# Patient Record
Sex: Male | Born: 1965 | Hispanic: Yes | Marital: Married | Smoking: Former smoker
Health system: Southern US, Community
[De-identification: ages and names within clinical notes are randomized; demographics above are authoritative.]

## PROBLEM LIST (undated history)

## (undated) DIAGNOSIS — I10 Essential (primary) hypertension: Secondary | ICD-10-CM

## (undated) DIAGNOSIS — M79609 Pain in unspecified limb: Secondary | ICD-10-CM

## (undated) HISTORY — PX: OTHER SURGICAL HISTORY: SHX169

## (undated) HISTORY — DX: Pain in unspecified limb: M79.609

## (undated) HISTORY — DX: Essential (primary) hypertension: I10

---

## 2009-12-26 ENCOUNTER — Emergency Department: Payer: Self-pay | Admitting: Unknown Physician Specialty

## 2011-06-08 IMAGING — CT CT ABD-PELV W/ CM
1 of 2 series · 15 of 32 positions shown, 19 images · non-contrast
Comparison: none

REASON FOR EXAM: (1) rectal bleeding pain in rectum; (2) same
COMMENTS:   LMP: (Male)

[Series 2: abd/pelvis · axial · 0.77mm/px · z∈[-528,-99]mm · 15 of 157 slices shown, 19 images]
[im 7/157  soft-tissue]
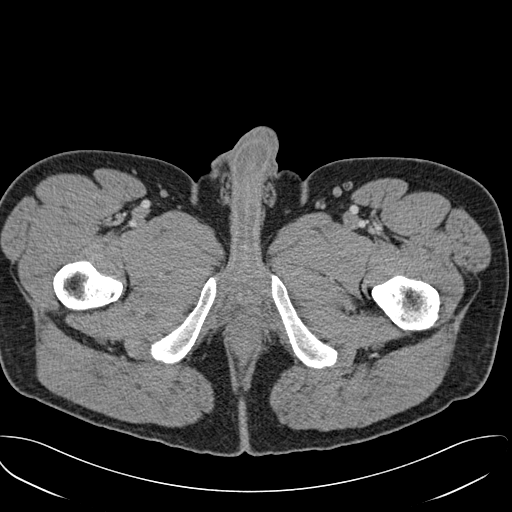
[im 7/157  bone]
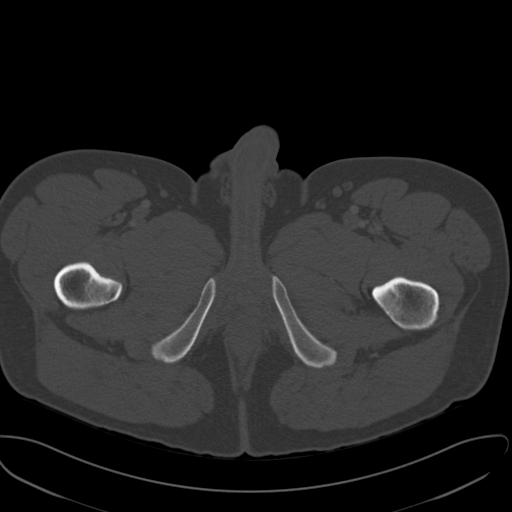
[im 20/157  soft-tissue]
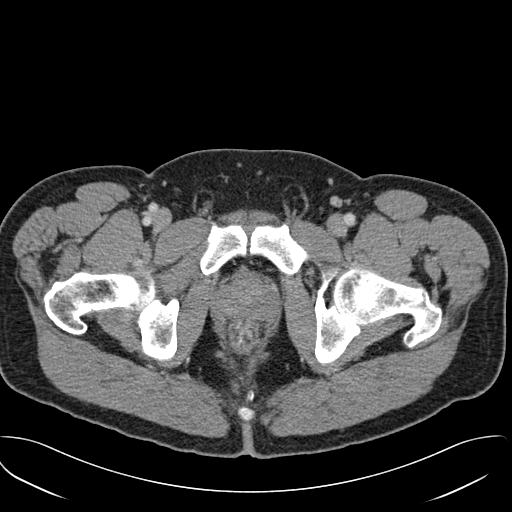
[im 33/157  soft-tissue]
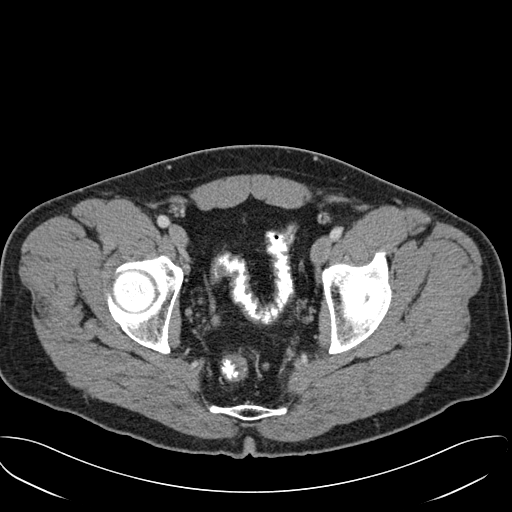
[im 46/157  soft-tissue]
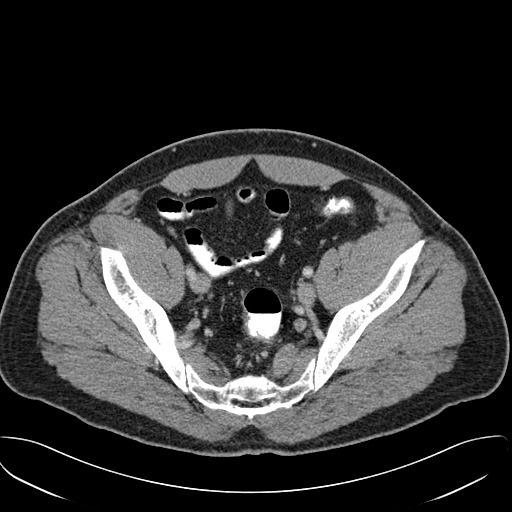
[im 53/157  soft-tissue]
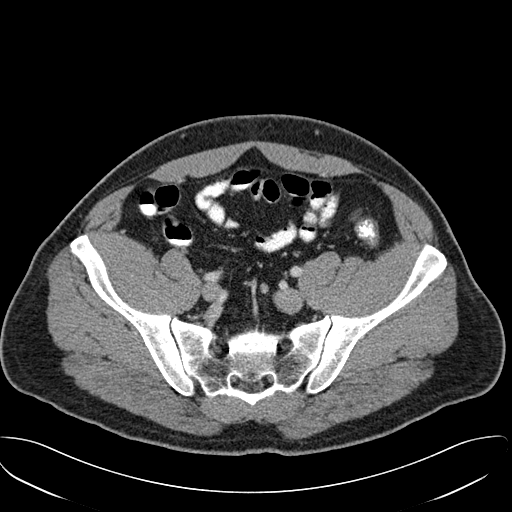
[im 66/157  soft-tissue]
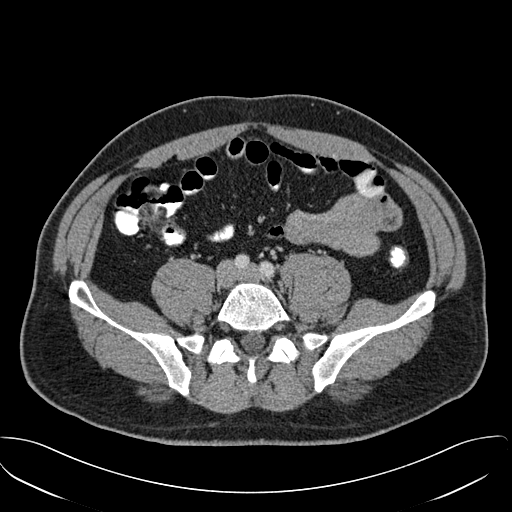
[im 79/157  soft-tissue]
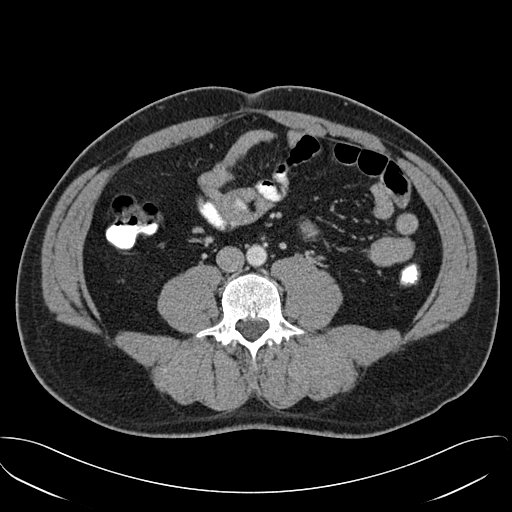
[im 92/157  soft-tissue]
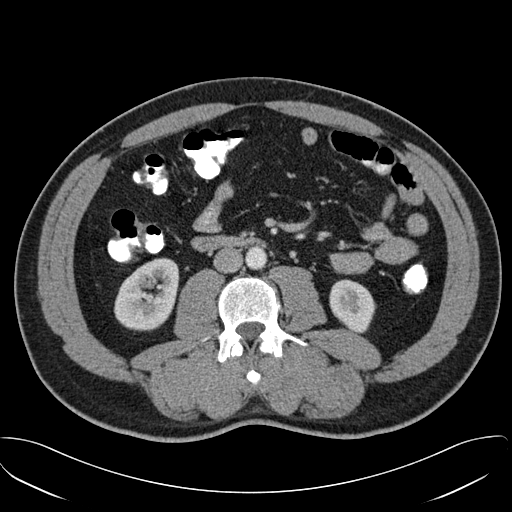
[im 105/157  soft-tissue]
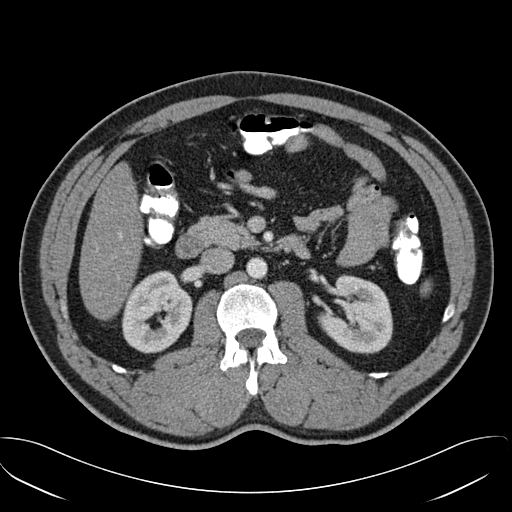
[im 105/157  bone]
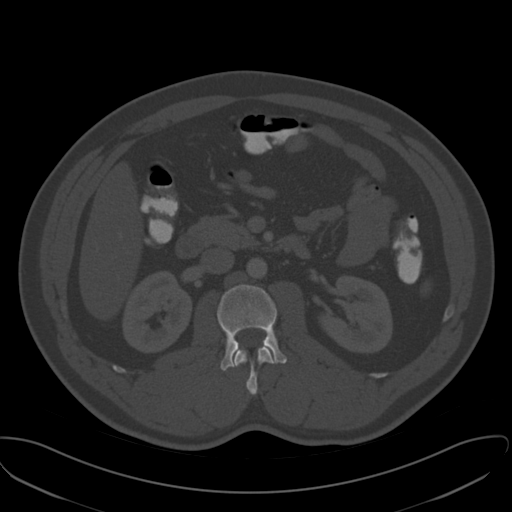
[im 111/157  soft-tissue]
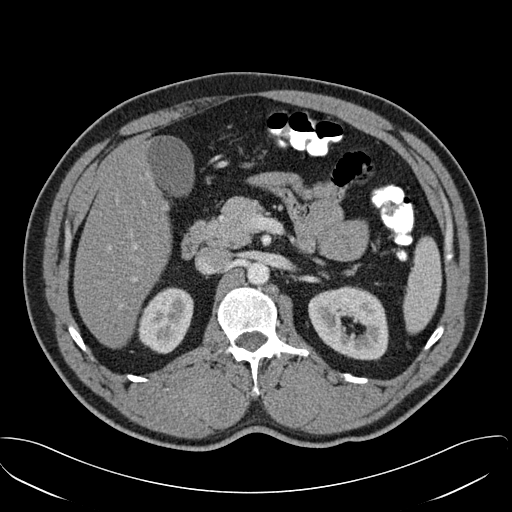
[im 124/157  soft-tissue]
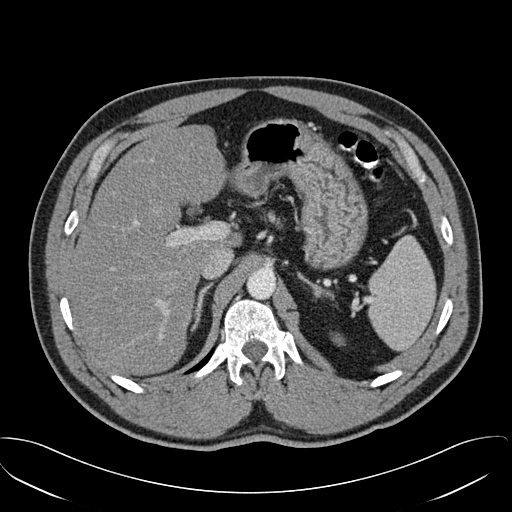
[im 131/157  lung]
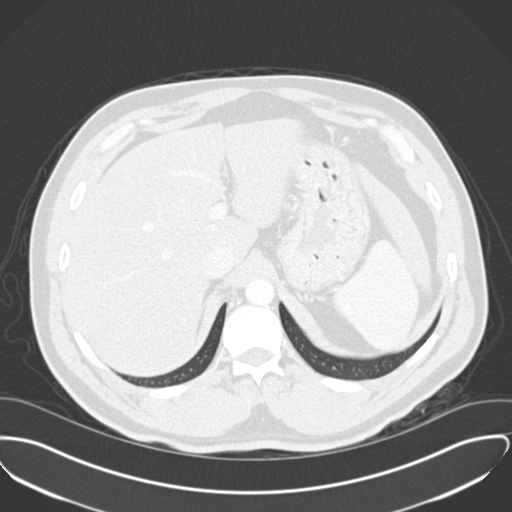
[im 137/157  soft-tissue]
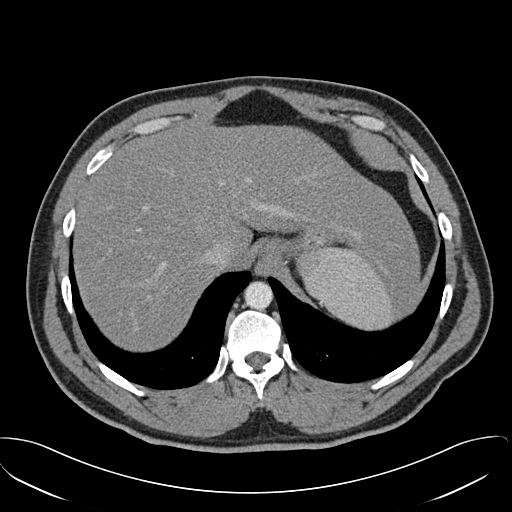
[im 137/157  lung]
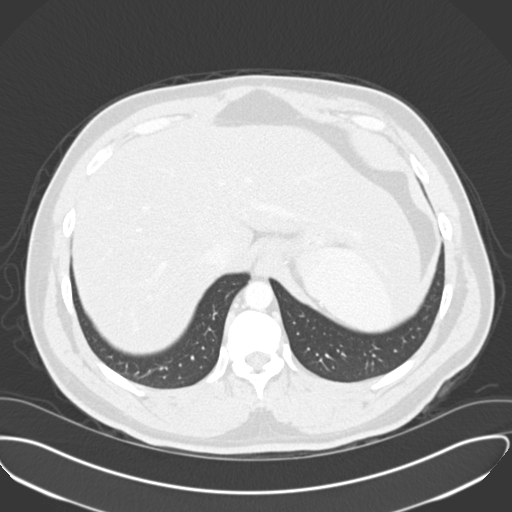
[im 144/157  lung]
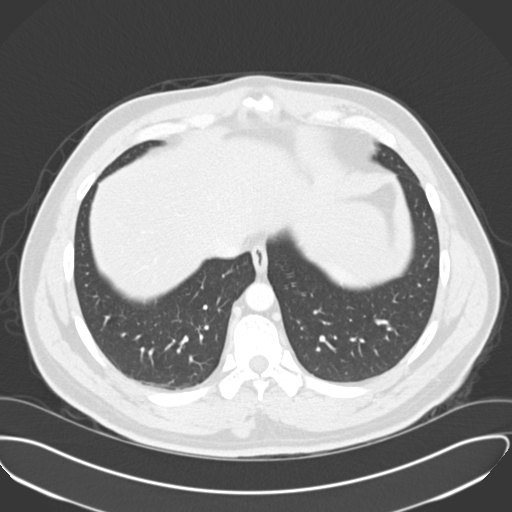
[im 150/157  soft-tissue]
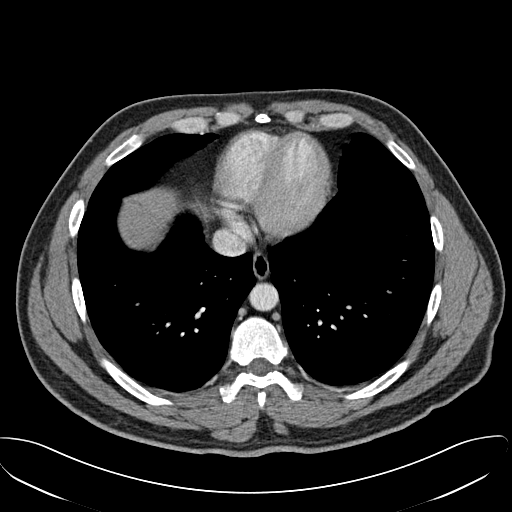
[im 150/157  lung]
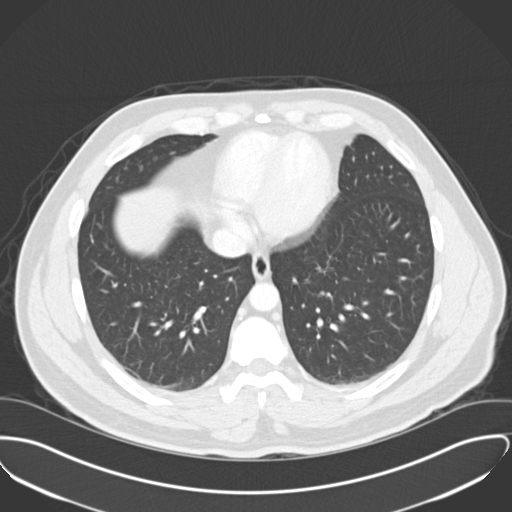

[15 of 32 positions shown; findings below may reference images not displayed]

PROCEDURE:     CT  - CT ABDOMEN / PELVIS  W  - December 27, 2009  [DATE]

RESULT:     Axial CT scanning was performed through the abdomen and pelvis
at 3 mm intervals and slice thicknesses following intravenous administration
of 100 cc of Lsovue-VAC. The patient also received oral contrast material.

The orally administered contrast has traversed the small and large bowel.
There is no evidence of obstruction or ileus or colitis. The appendix is
normal. The rectosigmoid colon exhibits mild thickening of the wall
diffusely but the perisigmoid fat exhibits no inflammatory change. I see no
perirectal abscess or evidence of a rectal mass. There is no free fluid in
the pelvis. No pelvic lymphadenopathy is demonstrated. The urinary bladder
and prostate gland and seminal vesicles are normal in appearance.

The liver exhibits mildly decreased density diffusely consistent with fatty
infiltration. There is no focal mass nor ductal dilation. The gallbladder is
adequately distended with no evidence of stones or inflammation. The
pancreas, spleen, nondistended stomach, adrenal glands, and kidneys exhibit
no acute abnormality. There is a 1 mm diameter midpole stone in the left
kidney. The caliber of the abdominal aorta is normal. I see no periaortic or
pericaval lymphadenopathy. There is no evidence of an inguinal nor umbilical
hernia. The lung bases exhibit no acute abnormality. The lumbar vertebral
bodies are preserved in height.
IMPRESSION: 1. There is very mild thickening of the wall of the rectosigmoid colon
diffusely. I do not see evidence of acute diverticulitis or other acute
rectosigmoid abnormality. No perirectal abscess is identified.
2. There is a nonobstructing 1 mm diameter midpole stone in the left kidney.
3. There are mild fatty infiltrative changes of the liver without evidence
of gallstones or ductal dilation.

A preliminary report was sent to the [HOSPITAL] the conclusion
of the study.

## 2012-09-15 ENCOUNTER — Emergency Department: Payer: Self-pay | Admitting: Emergency Medicine

## 2012-09-15 LAB — URINALYSIS, COMPLETE
Bacteria: NONE SEEN
Bilirubin,UR: NEGATIVE
Blood: NEGATIVE
Glucose,UR: NEGATIVE mg/dL (ref 0–75)
Leukocyte Esterase: NEGATIVE
Ph: 6 (ref 4.5–8.0)
Squamous Epithelial: NONE SEEN

## 2012-09-15 LAB — HEPATIC FUNCTION PANEL A (ARMC)
Bilirubin,Total: 0.4 mg/dL (ref 0.2–1.0)
SGOT(AST): 32 U/L (ref 15–37)
Total Protein: 7.1 g/dL (ref 6.4–8.2)

## 2012-09-15 LAB — BASIC METABOLIC PANEL
BUN: 17 mg/dL (ref 7–18)
Calcium, Total: 8.6 mg/dL (ref 8.5–10.1)
Chloride: 106 mmol/L (ref 98–107)
Co2: 24 mmol/L (ref 21–32)
EGFR (African American): >60
Osmolality: 282 (ref 275–301)
Potassium: 3.8 mmol/L (ref 3.5–5.1)
Sodium: 139 mmol/L (ref 136–145)

## 2012-09-15 LAB — LIPASE, BLOOD: Lipase: 138 U/L (ref 73–393)

## 2012-09-15 LAB — PREGNANCY, URINE: Pregnancy Test, Urine: NEGATIVE m[IU]/mL

## 2012-09-15 LAB — CBC
HCT: 40.2 % (ref 40.0–52.0)
HGB: 14 g/dL (ref 13.0–18.0)
MCH: 30.1 pg (ref 26.0–34.0)
Platelet: 203 10*3/uL (ref 150–440)
RDW: 14 % (ref 11.5–14.5)
WBC: 5.1 10*3/uL (ref 3.8–10.6)

## 2014-02-25 IMAGING — CR DG CHEST 2V
1 series · 2 of 2 positions shown · non-contrast
Comparison: none

REASON FOR EXAM: R lower chest pain
COMMENTS:

[Series 1: pa · 0.17mm/px · 2 of 2 slices shown]
[im 1/2]
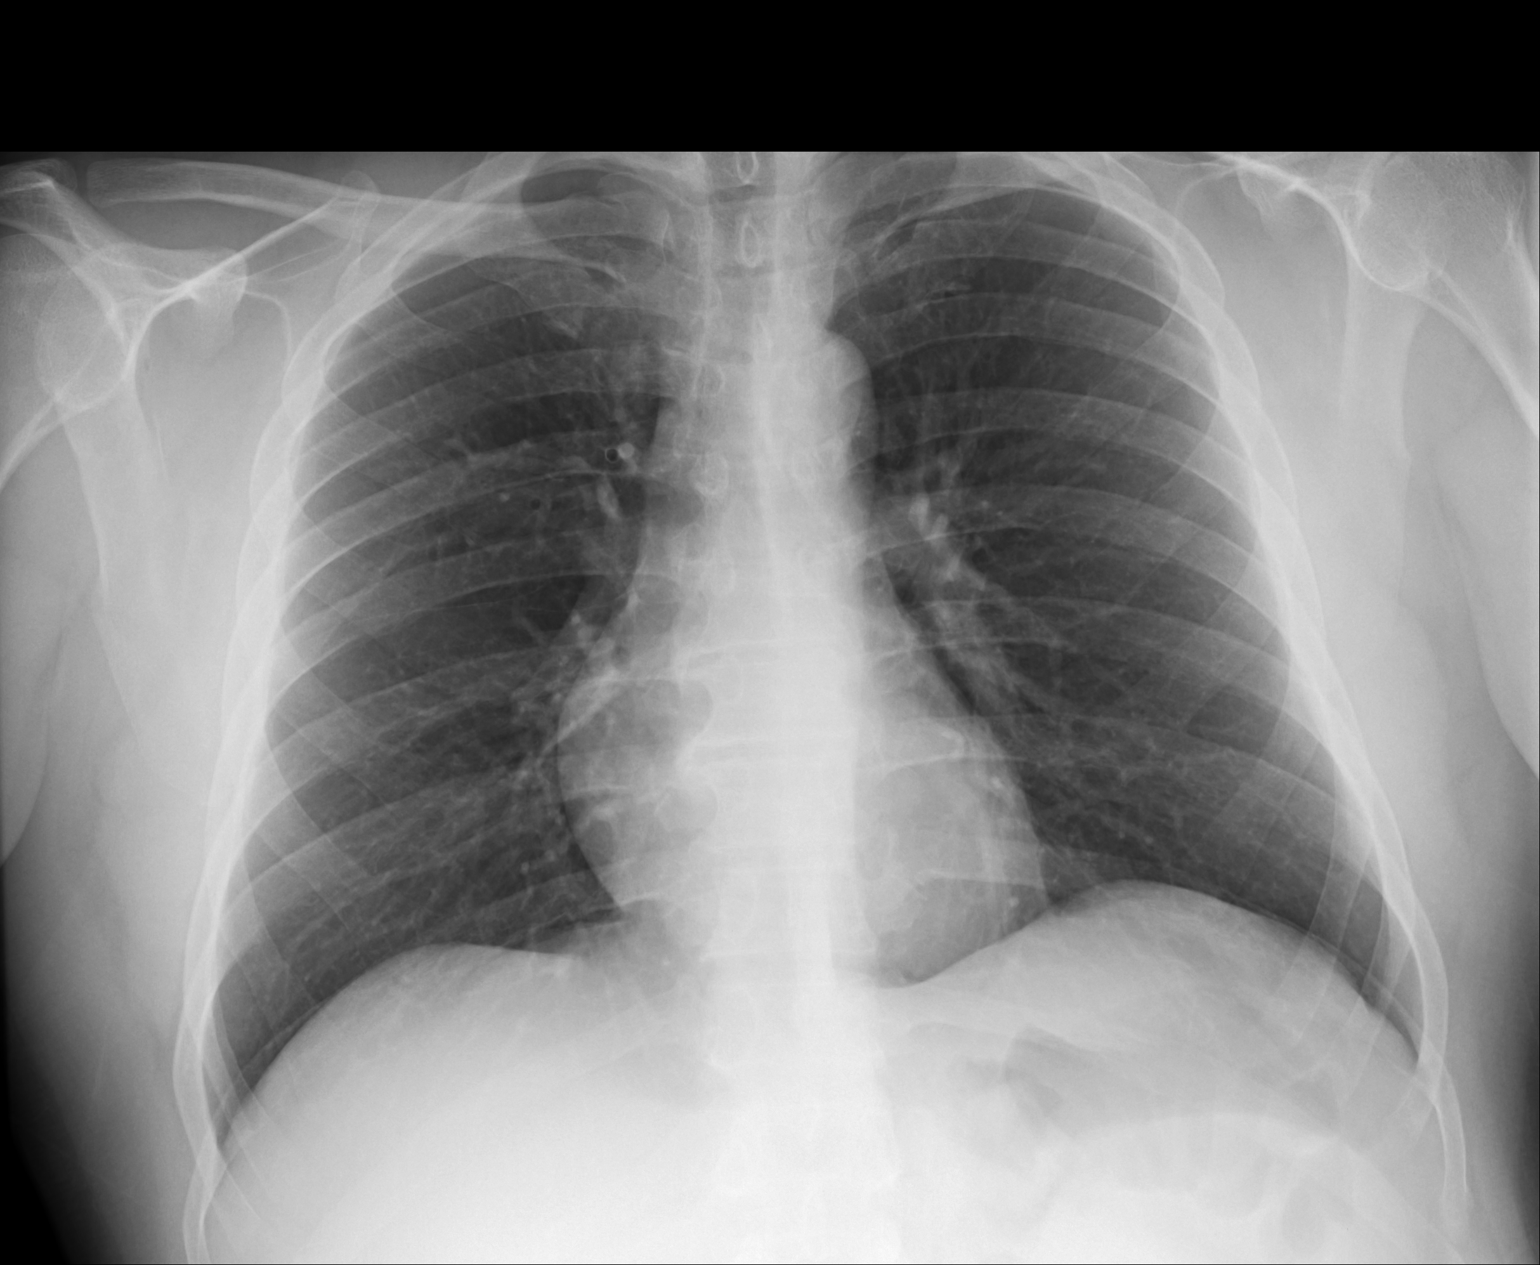
[im 2/2]
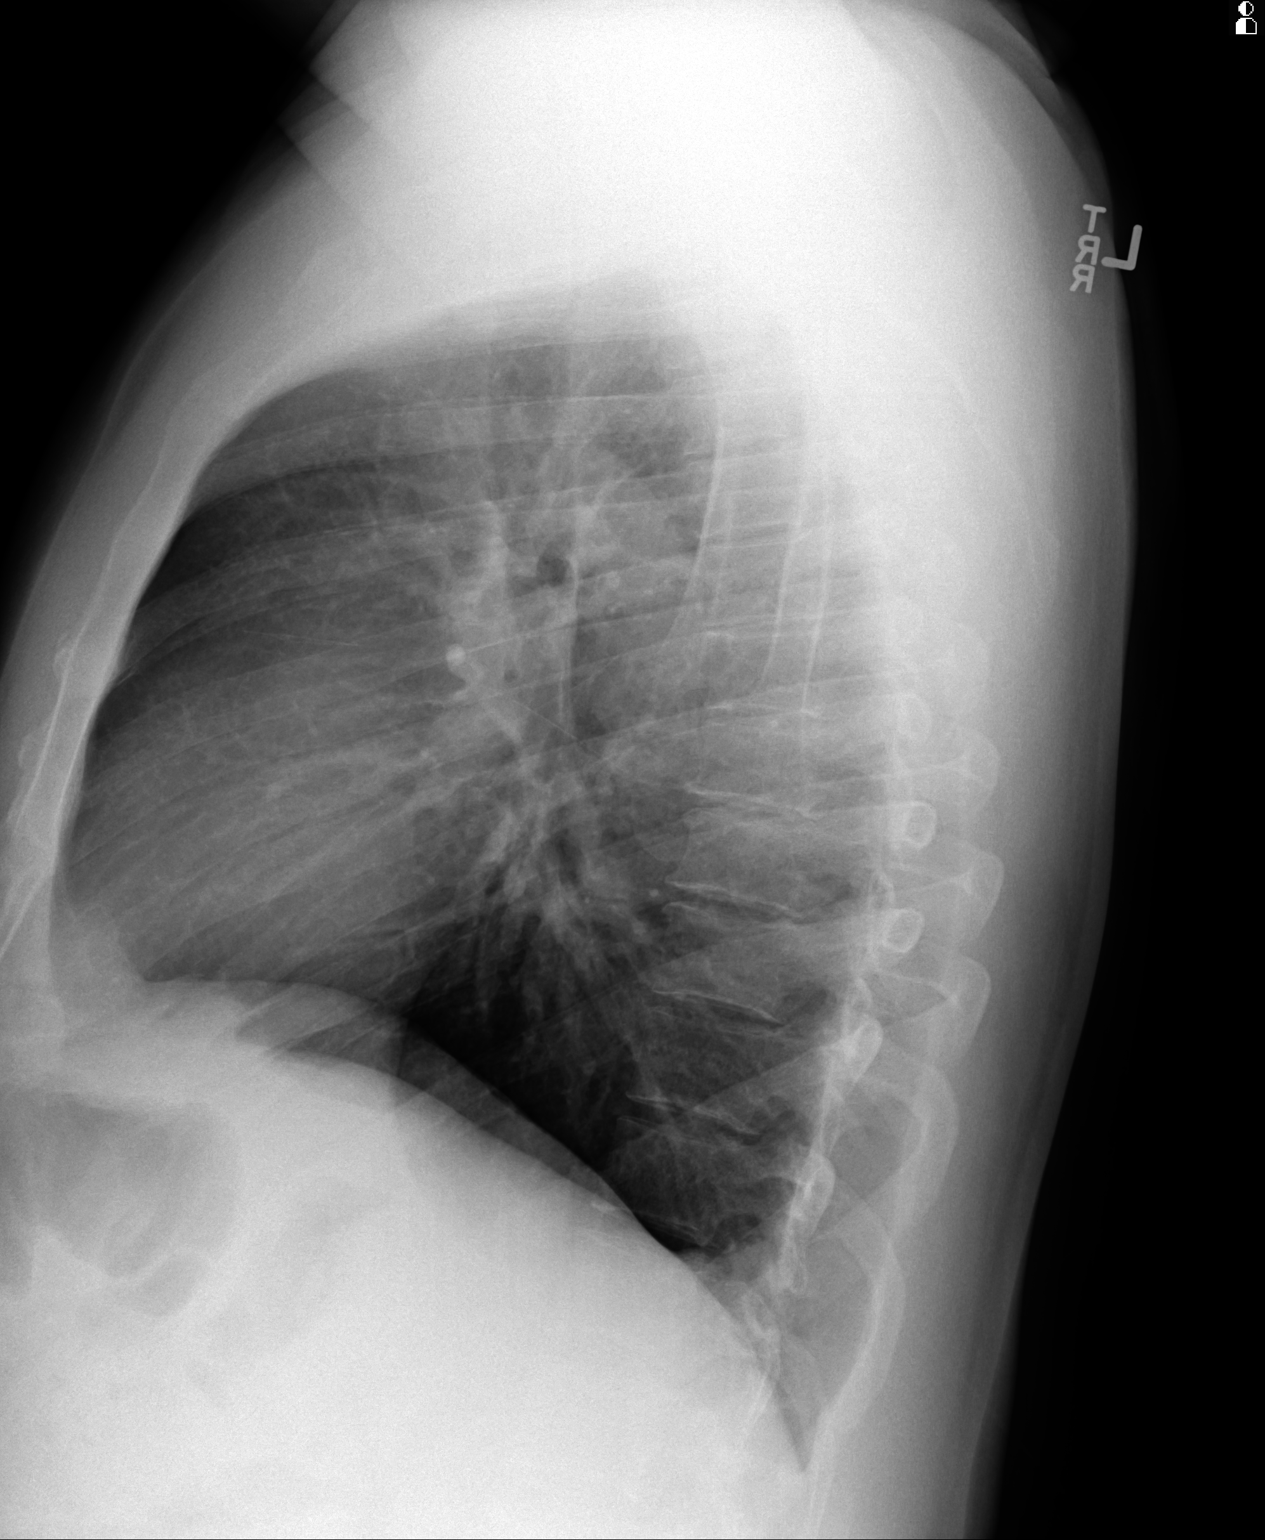

[2 of 2 positions shown; findings below may reference images not displayed]

PROCEDURE:     DXR - DXR CHEST PA (OR AP) AND LATERAL  - September 15, 2012  [DATE]

RESULT:     The lungs are adequately inflated. There is no focal infiltrate.
There is no free subdiaphragmatic gas. The cardiac silhouette is normal in
size. The mediastinum is normal in width. There is no pleural effusion or
pneumothorax. The bony thorax is normal in appearance.
IMPRESSION: There is no evidence of acute cardiopulmonary abnormality.

[REDACTED]

## 2016-09-20 ENCOUNTER — Encounter (INDEPENDENT_AMBULATORY_CARE_PROVIDER_SITE_OTHER): Payer: Self-pay | Admitting: Vascular Surgery

## 2016-09-20 ENCOUNTER — Ambulatory Visit (INDEPENDENT_AMBULATORY_CARE_PROVIDER_SITE_OTHER): Payer: BLUE CROSS/BLUE SHIELD | Admitting: Vascular Surgery

## 2016-09-20 DIAGNOSIS — M79604 Pain in right leg: Secondary | ICD-10-CM | POA: Diagnosis not present

## 2016-09-20 DIAGNOSIS — I8312 Varicose veins of left lower extremity with inflammation: Secondary | ICD-10-CM | POA: Diagnosis not present

## 2016-09-20 DIAGNOSIS — I872 Venous insufficiency (chronic) (peripheral): Secondary | ICD-10-CM | POA: Insufficient documentation

## 2016-09-20 DIAGNOSIS — I8311 Varicose veins of right lower extremity with inflammation: Secondary | ICD-10-CM | POA: Insufficient documentation

## 2016-09-20 DIAGNOSIS — M79605 Pain in left leg: Secondary | ICD-10-CM

## 2016-09-20 DIAGNOSIS — M79609 Pain in unspecified limb: Secondary | ICD-10-CM

## 2016-09-20 HISTORY — DX: Pain in unspecified limb: M79.609

## 2016-09-20 NOTE — Progress Notes (Signed)
MRN : [id number]  Barry [name] is a 51 y.o. (1965-12-02) male who presents with chief complaint of No chief complaint on file. Marland Kitchen  History of Present Illness: The patient is seen for evaluation of symptomatic varicose veins. The left leg is more affected than the right.  The patient relates burning and stinging which worsened steadily throughout the course of the day, particularly with standing. The patient also notes an aching and throbbing pain over the varicosities, particularly with prolonged dependent positions. The symptoms are significantly improved with elevation.  He is also concerned that there is increasing discoloration of the left ankle.  The patient states the pain from the varicose veins interferes with work, daily exercise, shopping and household maintenance. At this point, the symptoms are persistent and severe enough that they're having a negative impact on lifestyle and are interfering with daily activities.  There is no history of DVT, PE or superficial thrombophlebitis. There is no history of ulceration or hemorrhage. The patient denies a significant family history of varicose veins.  The patient has not worn graduated compression in the past. At the present time the patient has not been using over-the-counter analgesics. There is no history of prior surgical intervention or sclerotherapy.    No outpatient prescriptions have been marked as taking for the 09/20/16 encounter (Appointment) with Renford Dills, MD.    No past medical history on file.  No past surgical history on file.  Social History Social History  Substance Use Topics  . Smoking status: Not on file  . Smokeless tobacco: Not on file  . Alcohol use Not on file    Family History No family history on file. No family history of bleeding/clotting disorders, porphyria or autoimmune disease   Allergies not on file   REVIEW OF SYSTEMS (Negative unless checked)  Constitutional:  [] Weight loss  [] Fever  [] Chills Cardiac: [] Chest pain   [] Chest pressure   [] Palpitations   [] Shortness of breath when laying flat   [] Shortness of breath with exertion. Vascular:  [] Pain in legs with walking   [] Pain in legs at rest  [] History of DVT   [] Phlebitis   [x] Swelling in legs   [x] Varicose veins   [] Non-healing ulcers Pulmonary:   [] Uses home oxygen   [] Productive cough   [] Hemoptysis   [] Wheeze  [] COPD   [] Asthma Neurologic:  [] Dizziness   [] Seizures   [] History of stroke   [] History of TIA  [] Aphasia   [] Vissual changes   [] Weakness or numbness in arm   [] Weakness or numbness in leg Musculoskeletal:   [] Joint swelling   [] Joint pain   [] Low back pain Hematologic:  [] Easy bruising  [] Easy bleeding   [] Hypercoagulable state   [] Anemic Gastrointestinal:  [] Diarrhea   [] Vomiting  [] Gastroesophageal reflux/heartburn   [] Difficulty swallowing. Genitourinary:  [] Chronic kidney disease   [] Difficult urination  [] Frequent urination   [] Blood in urine Skin:  [] Rashes   [] Ulcers  Psychological:  [] History of anxiety   []  History of major depression.  Physical Examination  There were no vitals filed for this visit. There is no height or weight on file to calculate BMI. Gen: WD/WN, NAD Head: [place]/AT, No temporalis wasting.  Ear/Nose/Throat: Hearing grossly intact, nares w/o erythema or drainage, poor dentition Eyes: PER, EOMI, sclera nonicteric.  Neck: Supple, no masses.  No bruit or JVD.  Pulmonary:  Good air movement, clear to auscultation bilaterally, no use of accessory muscles.  Cardiac: RRR, normal S1, S2, no Murmurs. Vascular: large varicosities  are noted bilaterally >10 mm moderate venous stasis dermatitis left ankle Vessel Right Left  Radial Palpable Palpable  Ulnar Palpable Palpable  Brachial Palpable Palpable  Carotid Palpable Palpable  Femoral Palpable Palpable  Popliteal Palpable Palpable  PT Palpable Palpable  DP Palpable Palpable   Gastrointestinal: soft,  non-distended. No guarding/no peritoneal signs.  Musculoskeletal: M/S 5/5 throughout.  No deformity or atrophy.  Neurologic: CN 2-12 intact. Pain and light touch intact in extremities.  Symmetrical.  Speech is fluent. Motor exam as listed above. Psychiatric: Judgment intact, Mood & affect appropriate for pt's clinical situation. Dermatologic: venous rashes but no ulcers noted.  No changes consistent with cellulitis. Lymph : No Cervical lymphadenopathy, no lichenification or skin changes of chronic lymphedema.  CBC Lab Results  Component Value Date   WBC 5.1 09/15/2012   HGB 14.0 09/15/2012   HCT 40.2 09/15/2012   MCV 87 09/15/2012   PLT 203 09/15/2012    BMET    Component Value Date/Time   NA 139 09/15/2012 1154   K 3.8 09/15/2012 1154   CL 106 09/15/2012 1154   CO2 24 09/15/2012 1154   GLUCOSE 154 (H) 09/15/2012 1154   BUN 17 09/15/2012 1154   CREATININE 1.07 09/15/2012 1154   CALCIUM 8.6 09/15/2012 1154   GFRNONAA >60 09/15/2012 1154   GFRAA >60 09/15/2012 1154   CrCl cannot be calculated (Patient's most recent lab result is older than the maximum 21 days allowed.).  COAG No results found for: INR, PROTIME  Radiology No results found.  Assessment/Plan 1. Varicose veins of both lower extremities with inflammation  Recommend:  The patient has large symptomatic varicose veins that are painful and associated with swelling.  I have had a long discussion with the patient regarding  varicose veins and why they cause symptoms.  Patient will begin wearing graduated compression stockings class 1 on a daily basis, beginning first thing in the morning and removing them in the evening. The patient is instructed specifically not to sleep in the stockings.    The patient  will also begin using over-the-counter analgesics such as Motrin 600 mg po TID to help control the symptoms.    In addition, behavioral modification including elevation during the day will be initiated.     Pending the results of these changes the  patient will be reevaluated in three months.   An  ultrasound of the venous system will be obtained.   Further plans will be based on the ultrasound results and whether conservative therapies are successful at eliminating the pain and swelling.   2. Chronic venous insufficiency No surgery or intervention at this point in time.    I have had a long discussion with the patient regarding venous insufficiency and why it  causes symptoms. I have discussed with the patient the chronic skin changes that accompany venous insufficiency and the long term sequela such as infection and ulceration.  Patient will begin wearing graduated compression stockings class 1 (20-30 mmHg) or compression wraps on a daily basis a prescription was given. The patient will put the stockings on first thing in the morning and removing them in the evening. The patient is instructed specifically not to sleep in the stockings.    In addition, behavioral modification including several periods of elevation of the lower extremities during the day will be continued. I have demonstrated that proper elevation is a position with the ankles at heart level.  The patient is instructed to begin routine exercise, especially walking on  a daily basis  Patient should undergo duplex ultrasound of the venous system to ensure that DVT or reflux is not present.  Following the review of the ultrasound the patient will follow up in 2-3 months to reassess the degree of swelling and the control that graduated compression stockings or compression wraps  is offering.   The patient can be assessed for a Lymph Pump at that time  3. Pain in both lower extremities See #1    Levora Dredge, MD  09/20/2016 8:37 AM

## 2016-09-29 ENCOUNTER — Encounter (INDEPENDENT_AMBULATORY_CARE_PROVIDER_SITE_OTHER): Payer: Self-pay | Admitting: Primary Care

## 2016-11-02 ENCOUNTER — Ambulatory Visit (INDEPENDENT_AMBULATORY_CARE_PROVIDER_SITE_OTHER): Payer: Self-pay

## 2016-11-02 ENCOUNTER — Ambulatory Visit (INDEPENDENT_AMBULATORY_CARE_PROVIDER_SITE_OTHER): Payer: BLUE CROSS/BLUE SHIELD | Admitting: Vascular Surgery

## 2016-11-02 DIAGNOSIS — I8311 Varicose veins of right lower extremity with inflammation: Secondary | ICD-10-CM

## 2016-11-02 DIAGNOSIS — I8312 Varicose veins of left lower extremity with inflammation: Secondary | ICD-10-CM

## 2016-11-09 ENCOUNTER — Telehealth (INDEPENDENT_AMBULATORY_CARE_PROVIDER_SITE_OTHER): Payer: Self-pay | Admitting: Vascular Surgery

## 2016-11-09 NOTE — Telephone Encounter (Signed)
Left message to discuss venous duplex results.

## 2016-11-10 ENCOUNTER — Encounter (INDEPENDENT_AMBULATORY_CARE_PROVIDER_SITE_OTHER): Payer: Self-pay | Admitting: Vascular Surgery

## 2016-12-22 ENCOUNTER — Ambulatory Visit (INDEPENDENT_AMBULATORY_CARE_PROVIDER_SITE_OTHER): Payer: Self-pay | Admitting: Vascular Surgery

## 2018-02-14 ENCOUNTER — Telehealth: Payer: Self-pay | Admitting: Gastroenterology

## 2018-02-14 NOTE — Telephone Encounter (Signed)
Check on appt status on this patient.

## 2018-02-16 NOTE — Telephone Encounter (Signed)
Spoke with patient throught [facility] and told he needs to go through his Primary care doctor first. As soon as we receive a referral we will call him through [employer].

## 2018-02-20 ENCOUNTER — Encounter: Payer: Self-pay | Admitting: Primary Care

## 2018-02-21 ENCOUNTER — Encounter: Payer: Self-pay | Admitting: Gastroenterology

## 2018-04-05 ENCOUNTER — Ambulatory Visit: Payer: Self-pay | Admitting: Gastroenterology

## 2018-05-19 ENCOUNTER — Other Ambulatory Visit: Payer: Self-pay

## 2018-05-19 ENCOUNTER — Ambulatory Visit: Payer: BLUE CROSS/BLUE SHIELD | Admitting: Gastroenterology

## 2018-05-19 ENCOUNTER — Encounter: Payer: Self-pay | Admitting: Gastroenterology

## 2018-05-19 VITALS — BP 128/88 | HR 76 | Resp 17 | Wt 217.8 lb

## 2018-05-19 DIAGNOSIS — K5900 Constipation, unspecified: Secondary | ICD-10-CM

## 2018-05-19 DIAGNOSIS — Z800 Family history of malignant neoplasm of digestive organs: Secondary | ICD-10-CM

## 2018-05-19 DIAGNOSIS — K602 Anal fissure, unspecified: Secondary | ICD-10-CM

## 2018-05-19 DIAGNOSIS — Z1211 Encounter for screening for malignant neoplasm of colon: Secondary | ICD-10-CM | POA: Diagnosis not present

## 2018-05-19 NOTE — Progress Notes (Signed)
Arlyss Repress, MD [address]  [address]  [place], [place] [address]  Main: [phone]  Fax: [phone]    Gastroenterology Consultation  Referring Provider:     Sandrea Hughs, NP Primary Care Physician:  Center, [facility] Primary Gastroenterologist:  Dr. Arlyss Repress Reason for Consultation:     Colon cancer screening        HPI:   Barry [name] is a 52 y.o. Hispanic male referred by Dr. Eli Phillips, [facility]  for consultation & management of colon cancer screening and perianal itching, burning and discomfort.  Patient experiences chronic symptoms of perianal pain, sharp, worse during a bowel movement.  He is applying steroid cream per rectum which is not providing any relief.  He denies constipation, diarrhea or rectal bleeding.  He does report abdominal bloating.  He had a CT in 2011 which revealed thickening of the rectum.  He underwent anal biopsy for an abnormal tissue at [facility] in 2011, it was unremarkable.  Represented fibrous tissue and possible underlying anal fissure  NSAIDs: None  Antiplts/Anticoagulants/Anti thrombotics: None  GI Procedures: None Brother with colon cancer at age 13, alive He denies any GI surgeries  Past Medical History:  Diagnosis Date  . Hypertension     Past Surgical History:  Procedure Laterality Date  . rectum surgery      Current Outpatient Medications:  .  famotidine (PEPCID) 20 MG tablet, Take 40 mg by mouth., Disp: , Rfl:  .  hydrOXYzine (ATARAX/VISTARIL) 25 MG tablet, Take 25 mg by mouth., Disp: , Rfl:  .  lisinopril (PRINIVIL,ZESTRIL) 10 MG tablet, , Disp: , Rfl: 5   No family history on file.   Social History   Tobacco Use  . Smoking status: Former Games developer  . Smokeless tobacco: Never Used  Substance Use Topics  . Alcohol use: Yes    Comment: ocassionally  . Drug use: No    Allergies as of 05/19/2018 - Review Complete 05/19/2018  Allergen Reaction Noted  .  Penicillins Hives 09/28/2014    Review of Systems:    All systems reviewed and negative except where noted in HPI.   Physical Exam:  BP 128/88 (BP Location: Left Arm, Patient Position: Sitting, Cuff Size: Large)   Pulse 76   Resp 17   Wt 217 lb 12.8 oz (98.8 kg)   BMI 31.25 kg/m  No LMP for male patient.  General:   Alert,  Well-developed, well-nourished, pleasant and cooperative in NAD Head:  Normocephalic and atraumatic. Eyes:  Sclera clear, no icterus.   Conjunctiva pink. Ears:  Normal auditory acuity. Nose:  No deformity, discharge, or lesions. Mouth:  No deformity or lesions,oropharynx pink & moist. Neck:  Supple; no masses or thyromegaly. Lungs:  Respirations even and unlabored.  Clear throughout to auscultation.   No wheezes, crackles, or rhonchi. No acute distress. Heart:  Regular rate and rhythm; no murmurs, clicks, rubs, or gallops. Abdomen:  Normal bowel sounds. Soft, non-tender and non-distended without masses, hepatosplenomegaly or hernias noted.  No guarding or rebound tenderness.   Rectal: Not performed, perianal skin tags only Msk:  Symmetrical without gross deformities. Good, equal movement & strength bilaterally. Pulses:  Normal pulses noted. Extremities:  No clubbing or edema.  No cyanosis. Neurologic:  Alert and oriented x3;  grossly normal neurologically. Skin:  Intact without significant lesions or rashes. No jaundice. Lymph Nodes:  No significant cervical adenopathy. Psych:  Alert and cooperative. Normal mood and affect.  Imaging Studies:  Reviewed  Assessment and Plan:   Barry [name] is a 52 y.o. Hispanic male with rectal pain, sharp, worse with bowel movement.  History consistent with anal fissure.  He is also here to discuss about colon cancer screening  Probable anal fissure: Start 0.125% topical nitroglycerin plus lidocaine 2 times daily, instructions provided  Colon cancer screening: Discussed with him about colonoscopy given his  brother with history of colon cancer, patient agreeable  I have discussed alternative options, risks & benefits,  which include, but are not limited to, bleeding, infection, perforation,respiratory complication & drug reaction.  The patient agrees with this plan & written consent will be obtained.     Follow up in 4 weeks   Arlyss Repress, MD

## 2018-06-12 ENCOUNTER — Encounter: Payer: Self-pay | Admitting: *Deleted

## 2018-06-12 ENCOUNTER — Ambulatory Visit: Payer: BLUE CROSS/BLUE SHIELD | Admitting: Anesthesiology

## 2018-06-12 ENCOUNTER — Encounter
Admission: RE | Disposition: A | Discharge status: Home / Self Care | Payer: Self-pay | Source: Ambulatory Visit | Attending: Gastroenterology

## 2018-06-12 ENCOUNTER — Ambulatory Visit
Admission: RE | Admit: 2018-06-12 | Discharge: 2018-06-12 | Disposition: A | Discharge status: Home / Self Care | Payer: BLUE CROSS/BLUE SHIELD | Source: Ambulatory Visit | Attending: Gastroenterology | Admitting: Gastroenterology

## 2018-06-12 DIAGNOSIS — I10 Essential (primary) hypertension: Secondary | ICD-10-CM | POA: Diagnosis not present

## 2018-06-12 DIAGNOSIS — Z800 Family history of malignant neoplasm of digestive organs: Secondary | ICD-10-CM | POA: Insufficient documentation

## 2018-06-12 DIAGNOSIS — K644 Residual hemorrhoidal skin tags: Secondary | ICD-10-CM | POA: Insufficient documentation

## 2018-06-12 DIAGNOSIS — K5900 Constipation, unspecified: Secondary | ICD-10-CM

## 2018-06-12 DIAGNOSIS — Z79899 Other long term (current) drug therapy: Secondary | ICD-10-CM | POA: Diagnosis not present

## 2018-06-12 DIAGNOSIS — Z87891 Personal history of nicotine dependence: Secondary | ICD-10-CM | POA: Diagnosis not present

## 2018-06-12 DIAGNOSIS — Z1211 Encounter for screening for malignant neoplasm of colon: Secondary | ICD-10-CM | POA: Insufficient documentation

## 2018-06-12 HISTORY — PX: COLONOSCOPY WITH PROPOFOL: SHX5780

## 2018-06-12 SURGERY — COLONOSCOPY WITH PROPOFOL
Anesthesia: General

## 2018-06-12 MED ORDER — SODIUM CHLORIDE 0.9 % IV SOLN
INTRAVENOUS | Status: DC
  Administered 2018-06-12: 1000 mL via INTRAVENOUS

## 2018-06-12 MED ORDER — PROPOFOL 10 MG/ML IV BOLUS
INTRAVENOUS | Status: DC | PRN
  Administered 2018-06-12: 100 mg via INTRAVENOUS
  Administered 2018-06-12: 30 mg via INTRAVENOUS
  Administered 2018-06-12: 50 mg via INTRAVENOUS

## 2018-06-12 MED ORDER — PROPOFOL 500 MG/50ML IV EMUL
INTRAVENOUS | Status: DC | PRN
  Administered 2018-06-12: 175 ug/kg/min via INTRAVENOUS

## 2018-06-12 NOTE — Anesthesia Post-op Follow-up Note (Signed)
Anesthesia QCDR form completed.        

## 2018-06-12 NOTE — H&P (Signed)
Arlyss Repress, MD [address]  [address]  [place], [place] [address]  Main: [phone]  Fax: [phone] Pager: [phone]  Primary Care Physician:  Center, [facility] Primary Gastroenterologist:  Dr. Arlyss Repress  Pre-Procedure History & Physical: HPI:  Barry [name] is a 52 y.o. male is here for an colonoscopy.   Past Medical History:  Diagnosis Date  . Hypertension     Past Surgical History:  Procedure Laterality Date  . rectum surgery      Prior to Admission medications   Medication Sig Start Date End Date Taking? Authorizing Provider  famotidine (PEPCID) 20 MG tablet Take 40 mg by mouth. 09/27/14   [provider]  hydrOXYzine (ATARAX/VISTARIL) 25 MG tablet Take 25 mg by mouth. 09/28/14   [provider]  lisinopril (PRINIVIL,ZESTRIL) 10 MG tablet  08/31/16   [provider]    Allergies as of 05/19/2018 - Review Complete 05/19/2018  Allergen Reaction Noted  . Penicillins Hives 09/28/2014    History reviewed. No pertinent family history.  Social History   Socioeconomic History  . Marital status: Married    Spouse name: Not on file  . Number of children: Not on file  . Years of education: Not on file  . Highest education level: Not on file  Occupational History  . Not on file  Social Needs  . Financial resource strain: Not on file  . Food insecurity:    Worry: Not on file    Inability: Not on file  . Transportation needs:    Medical: Not on file    Non-medical: Not on file  Tobacco Use  . Smoking status: Former Games developer  . Smokeless tobacco: Never Used  Substance and Sexual Activity  . Alcohol use: Yes    Comment: daily  . Drug use: No  . Sexual activity: Not on file  Lifestyle  . Physical activity:    Days per week: Not on file    Minutes per session: Not on file  . Stress: Not on file  Relationships  . Social connections:    Talks on phone: Not on file    Gets together: Not on  file    Attends religious service: Not on file    Active member of club or organization: Not on file    Attends meetings of clubs or organizations: Not on file    Relationship status: Not on file  . Intimate partner violence:    Fear of current or ex partner: Not on file    Emotionally abused: Not on file    Physically abused: Not on file    Forced sexual activity: Not on file  Other Topics Concern  . Not on file  Social History Narrative  . Not on file    Review of Systems: See HPI, otherwise negative ROS  Physical Exam: BP (!) 130/97   Pulse 73   Temp (!) 97.1 F (36.2 C) (Tympanic)   Resp 18   Ht 5\' 8"  (1.727 m)   Wt 98.4 kg   SpO2 100%   BMI 32.99 kg/m  General:   Alert,  pleasant and cooperative in NAD Head:  Normocephalic and atraumatic. Neck:  Supple; no masses or thyromegaly. Lungs:  Clear throughout to auscultation.    Heart:  Regular rate and rhythm. Abdomen:  Soft, nontender and nondistended. Normal bowel sounds, without guarding, and without rebound.   Neurologic:  Alert and  oriented x4;  grossly normal neurologically.  Impression/Plan: [facility] for an colonoscopy to be performed for colon cancer screening  Risks, benefits, limitations, and alternatives regarding  colonoscopy have been reviewed with the patient.  Questions have been answered.  All parties agreeable.   Lannette Donathohini Rakeen Gaillard, MD  06/12/2018, 10:25 AM

## 2018-06-12 NOTE — Op Note (Signed)
[facility] Gastroenterology Patient Name: Barry [name] Procedure Date: 06/12/2018 11:20 AM MRN: [id number] Account #: [id number] Date of Birth: 05/11/1966 Admit Type: Outpatient Age: 52 Room: [facility] ENDO ROOM 2 Gender: Male Note Status: Finalized Procedure:            Colonoscopy Indications:          Screening for colorectal malignant neoplasm, This is                        the patient's first colonoscopy, Family history of                        colon cancer in a first-degree relative Providers:            Lin Landsman MD, MD Referring MD:         No Local Md, MD (Referring MD) Medicines:            Monitored Anesthesia Care Complications:        No immediate complications. Estimated blood loss: None. Procedure:            Pre-Anesthesia Assessment:                       - Prior to the procedure, a History and Physical was                        performed, and patient medications and allergies were                        reviewed. The patient is competent. The risks and                        benefits of the procedure and the sedation options and                        risks were discussed with the patient. All questions                        were answered and informed consent was obtained.                        Patient identification and proposed procedure were                        verified by the physician, the nurse, the                        anesthesiologist, the anesthetist and the technician in                        the pre-procedure area in the procedure room in the                        endoscopy suite. Mental Status Examination: alert and                        oriented. Airway Examination: normal oropharyngeal                        airway and neck mobility. Respiratory Examination:  clear to auscultation. CV Examination: normal.                        Prophylactic Antibiotics: The patient does not require                         prophylactic antibiotics. Prior Anticoagulants: The                        patient has taken no previous anticoagulant or                        antiplatelet agents. ASA Grade Assessment: II - A                        patient with mild systemic disease. After reviewing the                        risks and benefits, the patient was deemed in                        satisfactory condition to undergo the procedure. The                        anesthesia plan was to use monitored anesthesia care                        (MAC). Immediately prior to administration of                        medications, the patient was re-assessed for adequacy                        to receive sedatives. The heart rate, respiratory rate,                        oxygen saturations, blood pressure, adequacy of                        pulmonary ventilation, and response to care were                        monitored throughout the procedure. The physical status                        of the patient was re-assessed after the procedure.                       After obtaining informed consent, the colonoscope was                        passed under direct vision. Throughout the procedure,                        the patient's blood pressure, pulse, and oxygen                        saturations were monitored continuously. The                        Colonoscope  was introduced through the anus and                        advanced to the the terminal ileum, with identification                        of the appendiceal orifice and IC valve. The                        colonoscopy was performed without difficulty. The                        patient tolerated the procedure well. The quality of                        the bowel preparation was evaluated using the BBPS                        [employer] Bowel Preparation Scale) with scores of: Right                        Colon = 3, Transverse Colon = 3 and Left Colon =  3                        (entire mucosa seen well with no residual staining,                        small fragments of stool or opaque liquid). The total                        BBPS score equals 9. Findings:      The perianal and digital rectal examinations were normal. Pertinent       negatives include normal sphincter tone and no palpable rectal lesions.      The terminal ileum appeared normal.      The colon (entire examined portion) appeared normal.      External hemorrhoids were found during retroflexion. The hemorrhoids       were medium-sized. Keratosis on the mucosa overlying external hemorrhoids Impression:           - The examined portion of the ileum was normal.                       - The entire examined colon is normal.                       - External hemorrhoids.                       - No specimens collected. Recommendation:       - Discharge patient to home (with escort).                       - Resume previous diet today.                       - Continue present medications.                       - Repeat colonoscopy in 5 years for surveillance.                       -  Return to my office as previously scheduled. Dr. Ulyess Mort Lin Landsman MD, MD 06/12/2018 11:41:01 AM This report has been signed electronically. Number of Addenda: 0 Note Initiated On: 06/12/2018 11:20 AM Scope Withdrawal Time: 0 hours 10 minutes 16 seconds  Total Procedure Duration: 0 hours 13 minutes 59 seconds       [facility]

## 2018-06-12 NOTE — Anesthesia Preprocedure Evaluation (Signed)
Anesthesia Evaluation  Patient identified by MRN, date of birth, ID band Patient awake    Reviewed: Allergy & Precautions, NPO status , Patient's Chart, lab work & pertinent test results  History of Anesthesia Complications Negative for: history of anesthetic complications  Airway Mallampati: II  TM Distance: >3 FB Neck ROM: Full    Dental no notable dental hx.    Pulmonary neg sleep apnea, neg COPD, former smoker,    breath sounds clear to auscultation- rhonchi (-) wheezing      Cardiovascular Exercise Tolerance: Good hypertension, Pt. on medications (-) CAD, (-) Past MI, (-) Cardiac Stents and (-) CABG  Rhythm:Regular Rate:Normal - Systolic murmurs and - Diastolic murmurs    Neuro/Psych negative neurological ROS  negative psych ROS   GI/Hepatic negative GI ROS, Neg liver ROS,   Endo/Other  negative endocrine ROSneg diabetes  Renal/GU negative Renal ROS     Musculoskeletal negative musculoskeletal ROS (+)   Abdominal (+) + obese,   Peds  Hematology negative hematology ROS (+)   Anesthesia Other Findings Past Medical History: No date: Hypertension   Reproductive/Obstetrics                             Anesthesia Physical Anesthesia Plan  ASA: II  Anesthesia Plan: General   Post-op Pain Management:    Induction: Intravenous  PONV Risk Score and Plan: 1 and Propofol infusion  Airway Management Planned: Natural Airway  Additional Equipment:   Intra-op Plan:   Post-operative Plan:   Informed Consent: I have reviewed the patients History and Physical, chart, labs and discussed the procedure including the risks, benefits and alternatives for the proposed anesthesia with the patient or authorized representative who has indicated his/her understanding and acceptance.   Dental advisory given  Plan Discussed with: CRNA and Anesthesiologist  Anesthesia Plan Comments:          Anesthesia Quick Evaluation

## 2018-06-12 NOTE — Transfer of Care (Signed)
Immediate Anesthesia Transfer of Care Note  Patient: Barry [name]  Procedure(s) Performed: COLONOSCOPY WITH PROPOFOL (N/A )  Patient Location: PACU  Anesthesia Type:General  Level of Consciousness: sedated  Airway & Oxygen Therapy: Patient Spontanous Breathing and Patient connected to nasal cannula oxygen  Post-op Assessment: Report given to RN and Post -op Vital signs reviewed and stable  Post vital signs: Reviewed and stable  Last Vitals:  Vitals Value Taken Time  BP    Temp    Pulse 79 06/12/2018 11:41 AM  Resp 19 06/12/2018 11:41 AM  SpO2 100 % 06/12/2018 11:41 AM  Vitals shown include unvalidated device data.  Last Pain:  Vitals:   06/12/18 1009  TempSrc: Tympanic  PainSc: 0-No pain         Complications: No apparent anesthesia complications

## 2018-06-12 NOTE — Anesthesia Postprocedure Evaluation (Signed)
Anesthesia Post Note  [facility]  Procedure(s) Performed: COLONOSCOPY WITH PROPOFOL (N/A )  Patient location during evaluation: Endoscopy Anesthesia Type: General Level of consciousness: awake and alert and oriented Pain management: pain level controlled Vital Signs Assessment: post-procedure vital signs reviewed and stable Respiratory status: spontaneous breathing, nonlabored ventilation and respiratory function stable Cardiovascular status: blood pressure returned to baseline and stable Postop Assessment: no signs of nausea or vomiting Anesthetic complications: no     Last Vitals:  Vitals:   06/12/18 1009 06/12/18 1141  BP: (!) 130/97 98/68  Pulse: 73 79  Resp: 18 18  Temp: (!) 36.2 C (!) 36.1 C  SpO2: 100% 100%    Last Pain:  Vitals:   06/12/18 1221  TempSrc:   PainSc: 0-No pain                 Madell Heino

## 2018-06-12 NOTE — Anesthesia Procedure Notes (Signed)
Date/Time: 06/12/2018 11:29 AM Performed by: Junious SilkNoles, Chastity Noland, CRNA Pre-anesthesia Checklist: Patient identified, Emergency Drugs available, Suction available, Patient being monitored and Timeout performed Oxygen Delivery Method: Nasal cannula

## 2018-06-16 ENCOUNTER — Encounter: Payer: Self-pay | Admitting: Gastroenterology

## 2018-06-30 ENCOUNTER — Encounter: Payer: Self-pay | Admitting: Gastroenterology

## 2018-06-30 ENCOUNTER — Ambulatory Visit (INDEPENDENT_AMBULATORY_CARE_PROVIDER_SITE_OTHER): Payer: BLUE CROSS/BLUE SHIELD | Admitting: Gastroenterology

## 2018-06-30 VITALS — BP 150/92 | HR 67 | Resp 16 | Wt 223.0 lb

## 2018-06-30 DIAGNOSIS — K602 Anal fissure, unspecified: Secondary | ICD-10-CM | POA: Diagnosis not present

## 2018-06-30 DIAGNOSIS — K640 First degree hemorrhoids: Secondary | ICD-10-CM | POA: Diagnosis not present

## 2018-06-30 NOTE — Progress Notes (Signed)
Arlyss Repress, MD [address]  [address]  [place], [place] [address]  Main: [phone]  Fax: [phone]    Gastroenterology Consultation  Referring Provider:     [facility] Primary Care Physician:  [facility], [facility] Primary Gastroenterologist:  Dr. Arlyss Repress Reason for Consultation: Anal fissure, external hemorrhoids        HPI:   Barry [name] is a 52 y.o. Hispanic male referred by Dr. Eli Phillips, [facility]  for consultation & management of colon cancer screening and perianal itching, burning and discomfort.  Patient experiences chronic symptoms of perianal pain, sharp, worse during a bowel movement.  He is applying steroid cream per rectum which is not providing any relief.  He denies constipation, diarrhea or rectal bleeding.  He does report abdominal bloating.  He had a CT in 2011 which revealed thickening of the rectum.  He underwent anal biopsy for an abnormal tissue at [facility] in 2011, it was unremarkable.  Represented fibrous tissue and possible underlying anal fissure  Follow-up visit 06/30/2018: Patient reports the rectal discomfort is improving with topical nitro.  He denies constipation or diarrhea.  NSAIDs: None  Antiplts/Anticoagulants/Anti thrombotics: None  GI Procedures:  Colonoscopy 06/12/2018 - The examined portion of the ileum was normal. - The entire examined colon is normal. - External hemorrhoids. Brother with colon cancer at age 42, alive He denies any GI surgeries  Past Medical History:  Diagnosis Date  . Hypertension     Past Surgical History:  Procedure Laterality Date  . COLONOSCOPY WITH PROPOFOL N/A 06/12/2018   Procedure: COLONOSCOPY WITH PROPOFOL;  Surgeon: Toney Reil, MD;  Location: [facility] ENDOSCOPY;  Service: Gastroenterology;  Laterality: N/A;  . rectum surgery      Current Outpatient Medications:  .  ranitidine (ZANTAC) 150 MG capsule, Take 150 mg by mouth 2  (two) times daily., Disp: , Rfl:    No family history on file.   Social History   Tobacco Use  . Smoking status: Former Games developer  . Smokeless tobacco: Never Used  Substance Use Topics  . Alcohol use: Yes    Comment: daily  . Drug use: No    Allergies as of 06/30/2018 - Review Complete 06/30/2018  Allergen Reaction Noted  . Penicillins Hives 09/28/2014    Review of Systems:    All systems reviewed and negative except where noted in HPI.   Physical Exam:  BP (!) 150/92 (BP Location: Left Arm, Patient Position: Sitting, Cuff Size: Large)   Pulse 67   Resp 16   Wt 223 lb (101.2 kg)   BMI 33.91 kg/m  No LMP for male patient.  General:   Alert,  Well-developed, well-nourished, pleasant and cooperative in NAD Head:  Normocephalic and atraumatic. Eyes:  Sclera clear, no icterus.   Conjunctiva pink. Ears:  Normal auditory acuity. Nose:  No deformity, discharge, or lesions. Mouth:  No deformity or lesions,oropharynx pink & moist. Neck:  Supple; no masses or thyromegaly. Lungs:  Respirations even and unlabored.  Clear throughout to auscultation.   No wheezes, crackles, or rhonchi. No acute distress. Heart:  Regular rate and rhythm; no murmurs, clicks, rubs, or gallops. Abdomen:  Normal bowel sounds. Soft, non-tender and non-distended without masses, hepatosplenomegaly or hernias noted.  No guarding or rebound tenderness.   Rectal: perianal skin tags, moderate tenderness in the posterior wall of the anal canal Msk:  Symmetrical without gross deformities. Good, equal movement & strength bilaterally. Pulses:  Normal pulses  noted. Extremities:  No clubbing or edema.  No cyanosis. Neurologic:  Alert and oriented x3;  grossly normal neurologically. Skin:  Intact without significant lesions or rashes. No jaundice. Lymph Nodes:  No significant cervical adenopathy. Psych:  Alert and cooperative. Normal mood and affect.  Imaging Studies: Reviewed  Assessment and Plan:   Barry  [name] is a 52 y.o. Hispanic male with rectal pain, sharp, worse with bowel movement here for follow-up of anal fissure and external hemorrhoids.    Anal fissure: Continue 0.125% topical nitroglycerin plus lidocaine 2 times daily, instructions provided, refill nitro  External hemorrhoids: Discussed with patient about hemorrhoid ligation today Consent obtained Perform hemorrhoid ligation today  Colon cancer screening: Normal colonoscopy in 05/2018.  Recommend surveillance colonoscopy in 5 years given his brother with colon cancer at age 53  I have discussed alternative options, risks & benefits,  which include, but are not limited to, bleeding, infection, perforation,respiratory complication & drug reaction.  The patient agrees with this plan & written consent will be obtained.     Follow up in 2 weeks   Arlyss Repressohini R , MD

## 2018-07-14 ENCOUNTER — Ambulatory Visit: Payer: BLUE CROSS/BLUE SHIELD | Admitting: Gastroenterology

## 2019-11-03 ENCOUNTER — Ambulatory Visit: Payer: Self-pay | Attending: Internal Medicine

## 2019-11-03 DIAGNOSIS — Z23 Encounter for immunization: Secondary | ICD-10-CM

## 2019-11-03 NOTE — Progress Notes (Signed)
   Covid-19 Vaccination Clinic  Name:  Barry [name]    MRN: [id number] DOB: 1966/04/01  11/03/2019  Barry [name] was observed post Covid-19 immunization for 15 minutes without incident. He was provided with Vaccine Information Sheet and instruction to access the V-Safe system.   Barry [name] was instructed to call 911 with any severe reactions post vaccine: Marland Kitchen Difficulty breathing  . Swelling of face and throat  . A fast heartbeat  . A bad rash all over body  . Dizziness and weakness   Immunizations Administered    Name Date Dose VIS Date Route   Pfizer COVID-19 Vaccine 11/03/2019  1:45 PM 0.3 mL 07/06/2019 Intramuscular   Manufacturer: [employer], [employer]   Lot: [id number]   NDC: [address]-0

## 2019-11-28 ENCOUNTER — Ambulatory Visit: Payer: Self-pay | Attending: Internal Medicine

## 2019-11-28 DIAGNOSIS — Z23 Encounter for immunization: Secondary | ICD-10-CM

## 2019-11-28 NOTE — Progress Notes (Signed)
   Covid-19 Vaccination Clinic  Name:  Barry [name]    MRN: [id number] DOB: Apr 09, 1966  11/28/2019  Mr. Barry [name] was observed post Covid-19 immunization for 15 minutes without incident. He was provided with Vaccine Information Sheet and instruction to access the V-Safe system.   Mr. Barry [name] was instructed to call 911 with any severe reactions post vaccine: Marland Kitchen Difficulty breathing  . Swelling of face and throat  . A fast heartbeat  . A bad rash all over body  . Dizziness and weakness   Immunizations Administered    Name Date Dose VIS Date Route   Pfizer COVID-19 Vaccine 11/28/2019  1:38 PM 0.3 mL 09/19/2018 Intramuscular   Manufacturer: [employer], [employer]   Lot: [id number]   NDC: [address]-3
